# Patient Record
Sex: Male | Born: 1945 | Race: White | Hispanic: No | Marital: Married
Health system: Southern US, Community
[De-identification: ages and names within clinical notes are randomized; demographics above are authoritative.]

---

## 2016-01-30 DIAGNOSIS — Y92410 Unspecified street and highway as the place of occurrence of the external cause: Secondary | ICD-10-CM | POA: Diagnosis not present

## 2016-01-30 DIAGNOSIS — Y9389 Activity, other specified: Secondary | ICD-10-CM | POA: Insufficient documentation

## 2016-01-30 DIAGNOSIS — Y999 Unspecified external cause status: Secondary | ICD-10-CM | POA: Diagnosis not present

## 2016-01-30 DIAGNOSIS — S4992XA Unspecified injury of left shoulder and upper arm, initial encounter: Secondary | ICD-10-CM | POA: Diagnosis not present

## 2016-01-30 DIAGNOSIS — M25512 Pain in left shoulder: Secondary | ICD-10-CM | POA: Diagnosis present
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
## 2016-01-31 ENCOUNTER — Emergency Department: Payer: Commercial Managed Care - HMO

## 2016-01-31 ENCOUNTER — Emergency Department
Admission: EM | Admit: 2016-01-31 | Discharge: 2016-01-31 | Disposition: A | Discharge status: Home / Self Care | Payer: Commercial Managed Care - HMO | Attending: Emergency Medicine | Admitting: Emergency Medicine

## 2016-01-31 DIAGNOSIS — T1490XA Injury, unspecified, initial encounter: Secondary | ICD-10-CM

## 2016-01-31 DIAGNOSIS — S4992XA Unspecified injury of left shoulder and upper arm, initial encounter: Secondary | ICD-10-CM | POA: Diagnosis not present

## 2016-01-31 DIAGNOSIS — M25512 Pain in left shoulder: Secondary | ICD-10-CM

## 2016-01-31 MED ORDER — OXYCODONE-ACETAMINOPHEN 5-325 MG PO TABS
1.0000 | ORAL_TABLET | Freq: Once | ORAL | Status: AC
  Administered 2016-01-31: 1 via ORAL

## 2016-01-31 MED ORDER — OXYCODONE-ACETAMINOPHEN 5-325 MG PO TABS: 1.0000 | ORAL_TABLET | ORAL | 0 refills | Status: AC | PRN

## 2016-01-31 MED ORDER — BACITRACIN ZINC 500 UNIT/GM EX OINT
TOPICAL_OINTMENT | Freq: Once | CUTANEOUS | Status: AC
  Administered 2016-01-31: 1 via TOPICAL

## 2016-01-31 MED ORDER — BACITRACIN ZINC 500 UNIT/GM EX OINT
TOPICAL_OINTMENT | CUTANEOUS | Status: AC
  Administered 2016-01-31: 1 via TOPICAL
  Filled 2016-01-31: qty 0.9

## 2016-01-31 MED ORDER — OXYCODONE-ACETAMINOPHEN 5-325 MG PO TABS
ORAL_TABLET | ORAL | Status: AC
  Administered 2016-01-31: 1 via ORAL
  Filled 2016-01-31: qty 1

## 2016-01-31 MED ORDER — OXYCODONE-ACETAMINOPHEN 5-325 MG PO TABS
2.0000 | ORAL_TABLET | Freq: Once | ORAL | Status: AC
  Administered 2016-01-31: 2 via ORAL
  Filled 2016-01-31: qty 2

## 2016-01-31 NOTE — ED Notes (Signed)
Pt. In room #8 with wife waiting for her discharge.

## 2016-01-31 NOTE — ED Notes (Signed)
Patient transported to X-ray 

## 2016-01-31 NOTE — ED Notes (Signed)
Pt's left arm placed in sling
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
## 2016-01-31 NOTE — ED Notes (Signed)
Pt returned from xray
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
## 2016-06-22 DIAGNOSIS — R3914 Feeling of incomplete bladder emptying: Secondary | ICD-10-CM | POA: Diagnosis not present

## 2016-06-22 DIAGNOSIS — R350 Frequency of micturition: Secondary | ICD-10-CM | POA: Diagnosis not present

## 2016-06-22 DIAGNOSIS — N403 Nodular prostate with lower urinary tract symptoms: Secondary | ICD-10-CM | POA: Diagnosis not present

## 2016-06-22 DIAGNOSIS — R3915 Urgency of urination: Secondary | ICD-10-CM | POA: Diagnosis not present

## 2016-06-22 DIAGNOSIS — N5201 Erectile dysfunction due to arterial insufficiency: Secondary | ICD-10-CM | POA: Diagnosis not present

## 2016-06-22 DIAGNOSIS — R351 Nocturia: Secondary | ICD-10-CM | POA: Diagnosis not present

## 2016-06-28 DIAGNOSIS — N403 Nodular prostate with lower urinary tract symptoms: Secondary | ICD-10-CM | POA: Diagnosis not present

## 2016-06-28 DIAGNOSIS — R102 Pelvic and perineal pain: Secondary | ICD-10-CM | POA: Diagnosis not present

## 2016-06-28 DIAGNOSIS — R972 Elevated prostate specific antigen [PSA]: Secondary | ICD-10-CM | POA: Diagnosis not present

## 2016-06-28 DIAGNOSIS — N4232 Atypical small acinar proliferation of prostate: Secondary | ICD-10-CM | POA: Diagnosis not present

## 2016-06-28 DIAGNOSIS — C61 Malignant neoplasm of prostate: Secondary | ICD-10-CM | POA: Diagnosis not present

## 2016-07-13 DIAGNOSIS — N5201 Erectile dysfunction due to arterial insufficiency: Secondary | ICD-10-CM | POA: Diagnosis not present

## 2016-07-13 DIAGNOSIS — R972 Elevated prostate specific antigen [PSA]: Secondary | ICD-10-CM | POA: Diagnosis not present

## 2016-07-13 DIAGNOSIS — C61 Malignant neoplasm of prostate: Secondary | ICD-10-CM | POA: Diagnosis not present

## 2016-11-07 DIAGNOSIS — M65312 Trigger thumb, left thumb: Secondary | ICD-10-CM | POA: Diagnosis not present

## 2016-11-07 DIAGNOSIS — M79642 Pain in left hand: Secondary | ICD-10-CM | POA: Diagnosis not present

## 2016-12-21 DIAGNOSIS — N5201 Erectile dysfunction due to arterial insufficiency: Secondary | ICD-10-CM | POA: Diagnosis not present

## 2016-12-21 DIAGNOSIS — C61 Malignant neoplasm of prostate: Secondary | ICD-10-CM | POA: Diagnosis not present

## 2016-12-21 DIAGNOSIS — D400 Neoplasm of uncertain behavior of prostate: Secondary | ICD-10-CM | POA: Diagnosis not present

## 2017-04-17 DIAGNOSIS — J069 Acute upper respiratory infection, unspecified: Secondary | ICD-10-CM | POA: Diagnosis not present
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
## 2017-11-06 IMAGING — CR DG HAND COMPLETE 3+V*R*
1 series · 3 of 3 positions shown · non-contrast
Comparison: None.

CLINICAL DATA: Pain and laceration to the fourth metacarpal

EXAM:
RIGHT HAND - COMPLETE 3+ VIEW

[Series 1: dg hand complete right · 0.14mm/px · 3 of 3 slices shown]
[im 1/3]
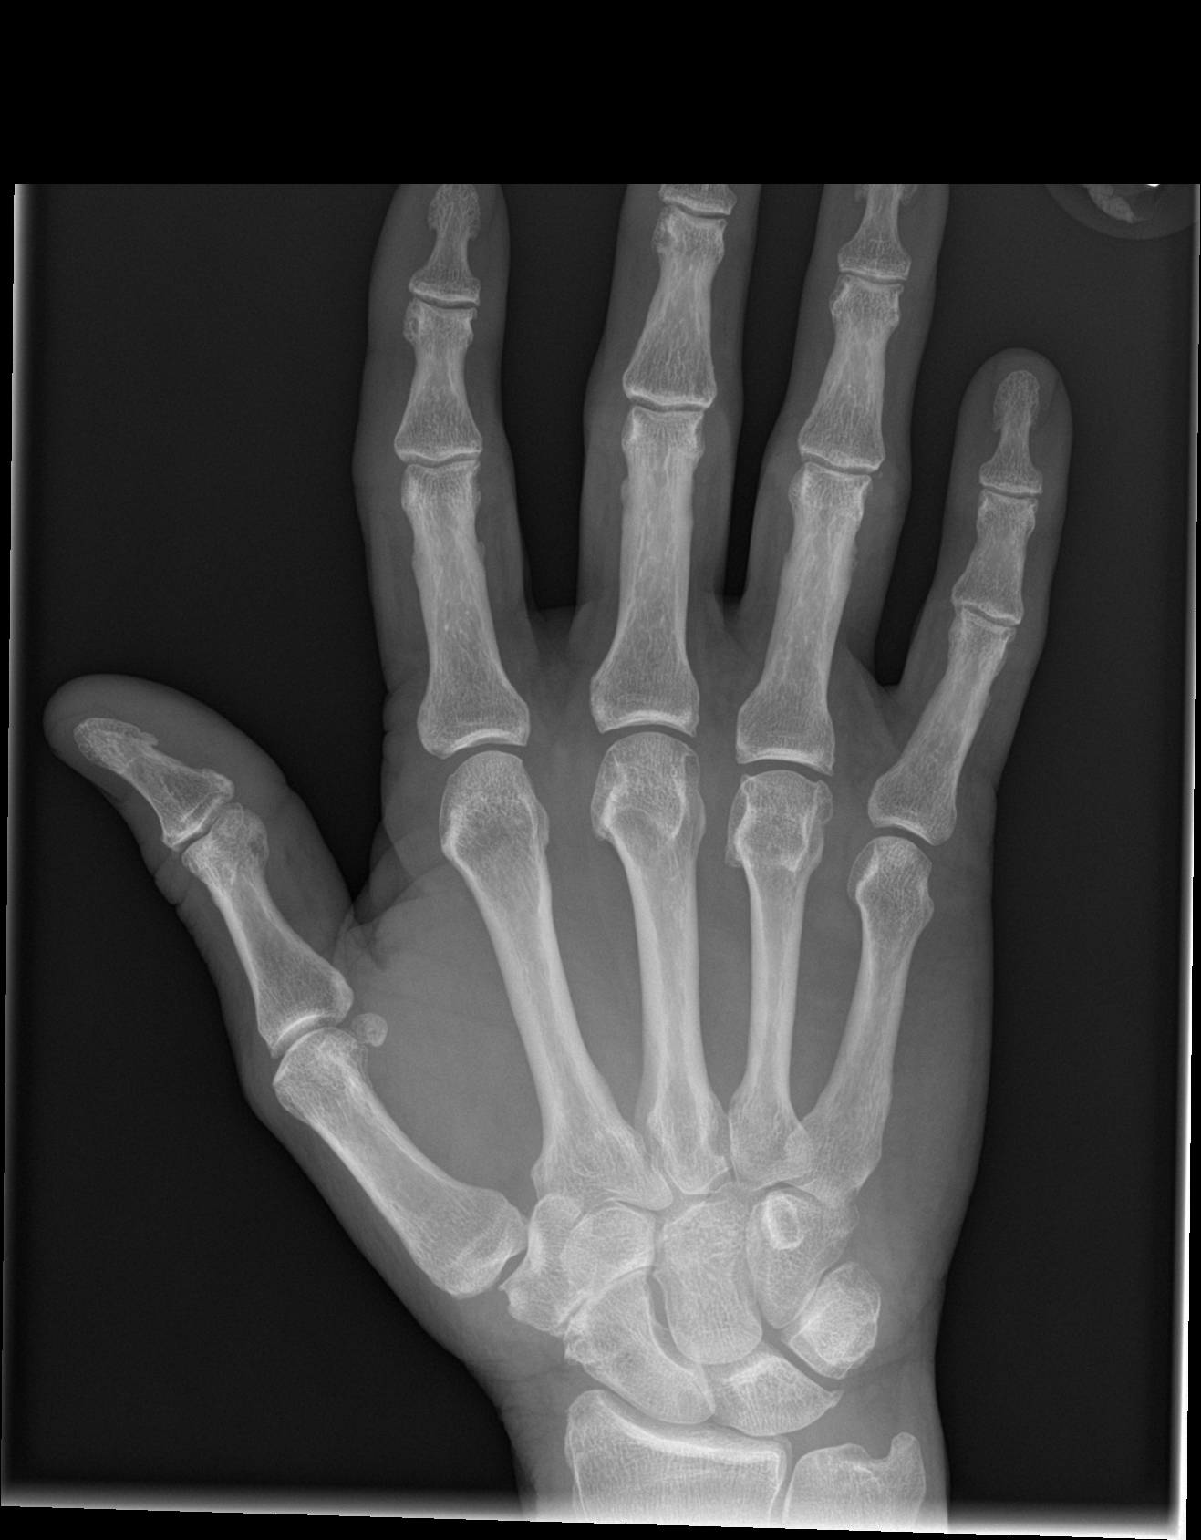
[im 2/3]
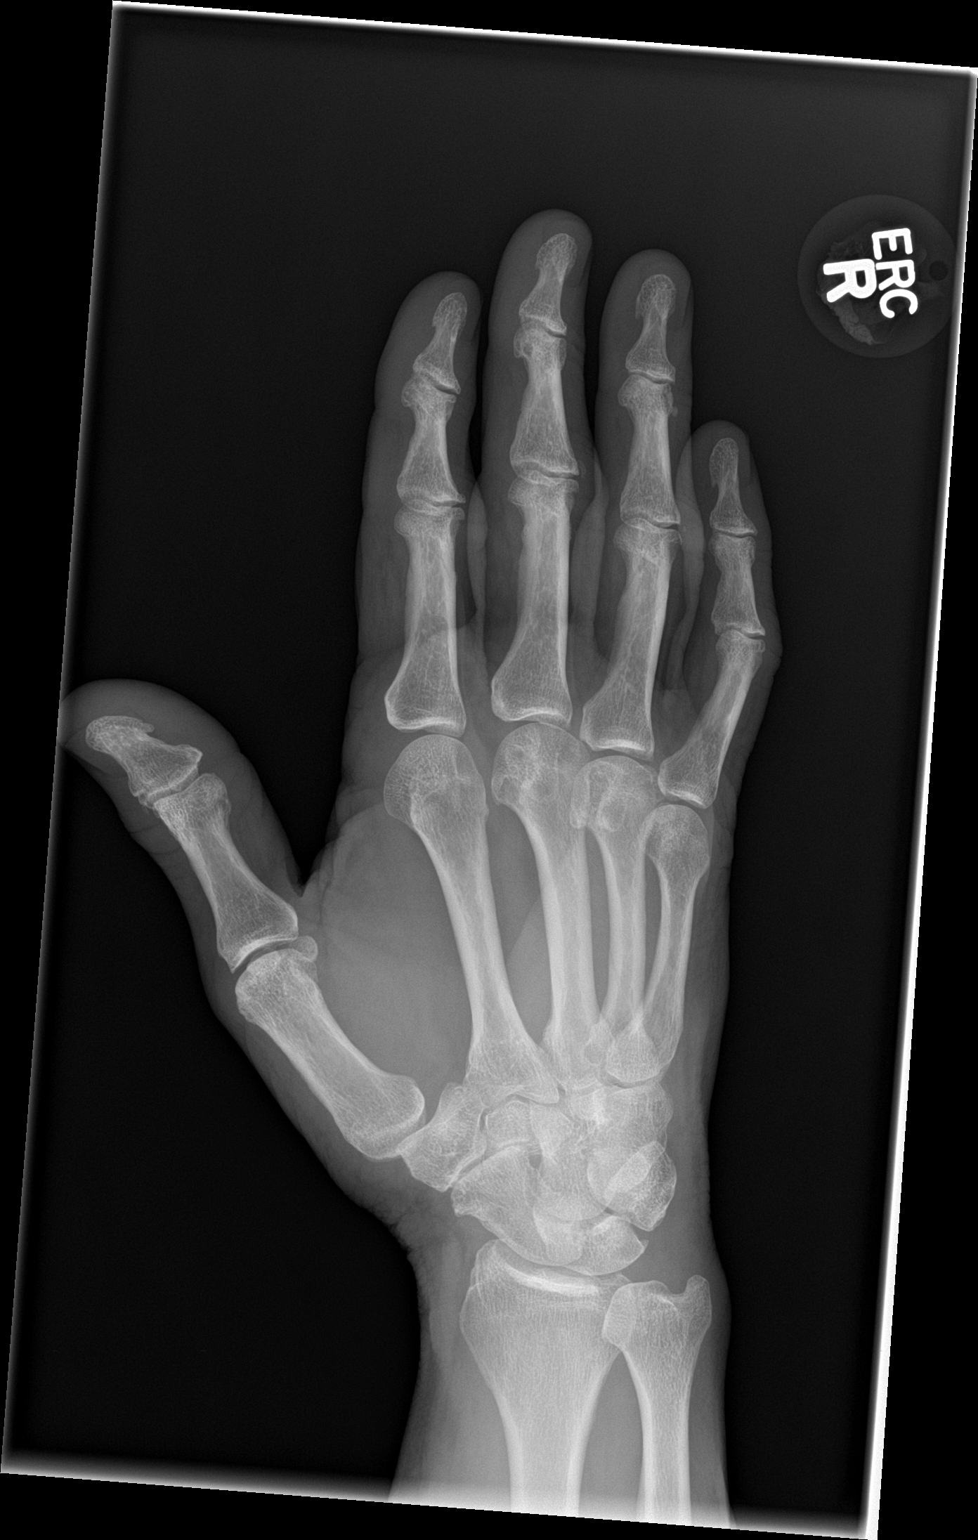
[im 3/3]
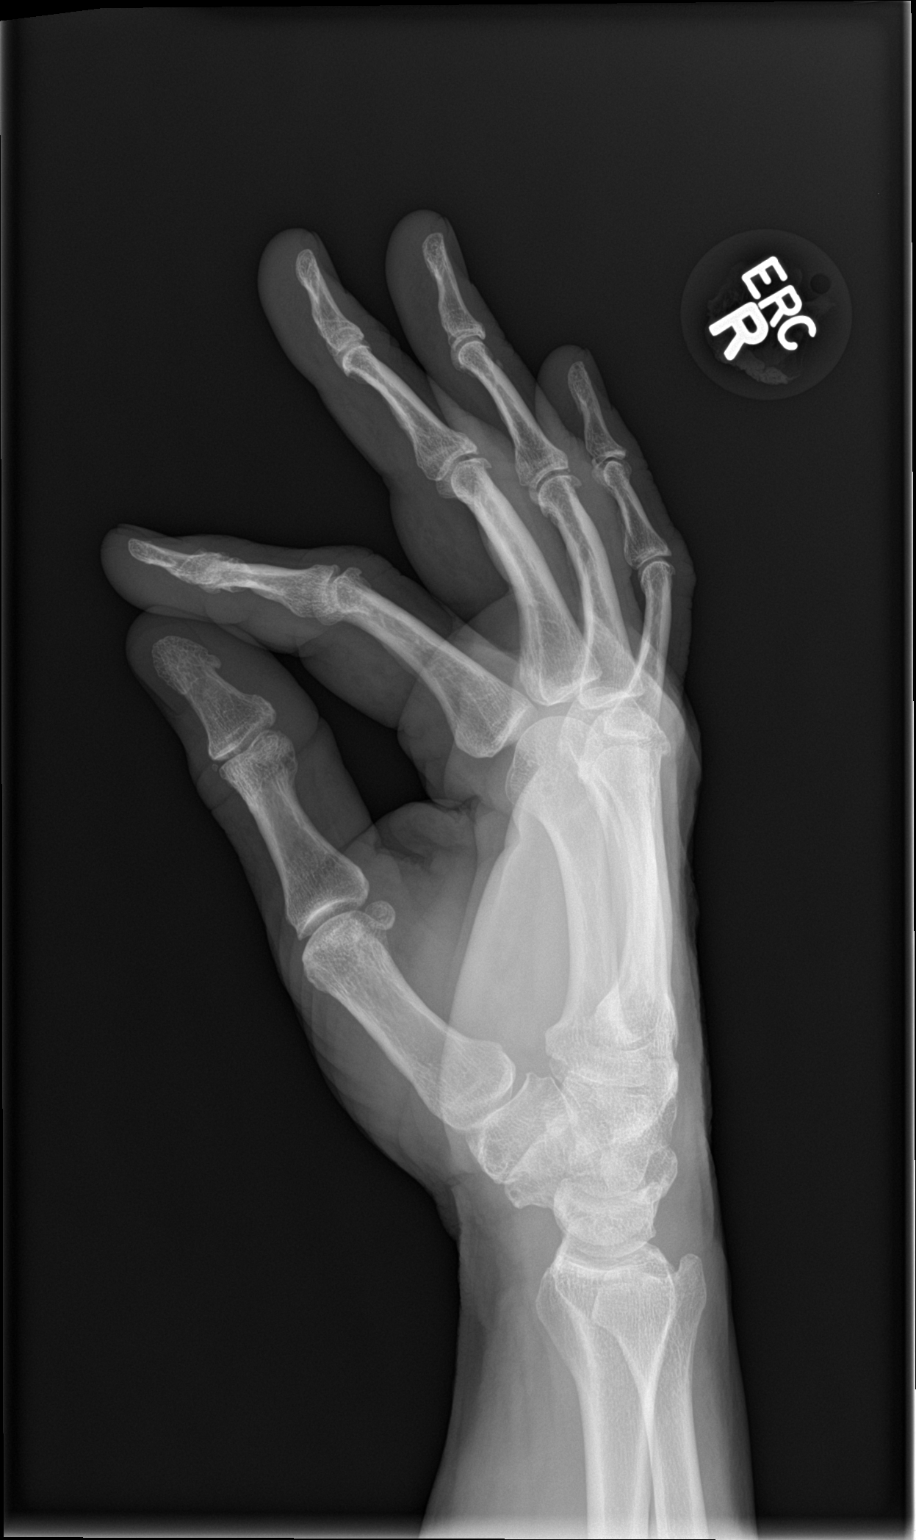

[3 of 3 positions shown; findings below may reference images not displayed]

FINDINGS: No acute displaced fracture or malalignment. Degenerative changes at
the second through fifth DIP and PIP joints. No radiopaque foreign
body.
IMPRESSION: No acute osseous abnormality.

## 2018-09-11 ENCOUNTER — Ambulatory Visit: Payer: Medicare HMO | Attending: Hand Surgery | Admitting: Occupational Therapy

## 2018-09-11 ENCOUNTER — Other Ambulatory Visit: Payer: Self-pay

## 2018-09-11 DIAGNOSIS — M25632 Stiffness of left wrist, not elsewhere classified: Secondary | ICD-10-CM | POA: Insufficient documentation

## 2018-09-11 DIAGNOSIS — R600 Localized edema: Secondary | ICD-10-CM | POA: Diagnosis present

## 2018-09-11 DIAGNOSIS — M25642 Stiffness of left hand, not elsewhere classified: Secondary | ICD-10-CM | POA: Diagnosis present

## 2018-09-11 DIAGNOSIS — M25532 Pain in left wrist: Secondary | ICD-10-CM | POA: Diagnosis present

## 2018-09-11 DIAGNOSIS — M6281 Muscle weakness (generalized): Secondary | ICD-10-CM | POA: Diagnosis present

## 2018-09-11 DIAGNOSIS — L905 Scar conditions and fibrosis of skin: Secondary | ICD-10-CM | POA: Insufficient documentation

## 2018-09-11 DIAGNOSIS — M79642 Pain in left hand: Secondary | ICD-10-CM
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
## 2018-09-11 NOTE — Patient Instructions (Signed)
Contrast 2-3 x day Knuckle bender on for 1-3 min while doing composite flexion of digits  keep pain under 2/10  5 reps hold 5 sec  Remove  And do tendon glides - block - and 10 reps  composite fist to 2 cm foam block  10 reps   AAROM for wrist over edge of table  10 reps  AROM for UD, RD, sup /pro and flexion ,ext of wrist  10 reps  Keep pain under 2/10  And reinforce weight limited for pt 5 lbs

## 2018-09-13 ENCOUNTER — Other Ambulatory Visit: Payer: Self-pay

## 2018-09-13 ENCOUNTER — Ambulatory Visit: Payer: Medicare HMO | Admitting: Occupational Therapy

## 2018-09-13 DIAGNOSIS — M25532 Pain in left wrist: Secondary | ICD-10-CM | POA: Diagnosis not present

## 2018-09-13 DIAGNOSIS — R600 Localized edema: Secondary | ICD-10-CM

## 2018-09-13 DIAGNOSIS — M6281 Muscle weakness (generalized): Secondary | ICD-10-CM

## 2018-09-13 DIAGNOSIS — L905 Scar conditions and fibrosis of skin: Secondary | ICD-10-CM

## 2018-09-13 DIAGNOSIS — M25632 Stiffness of left wrist, not elsewhere classified: Secondary | ICD-10-CM

## 2018-09-13 DIAGNOSIS — M79642 Pain in left hand: Secondary | ICD-10-CM

## 2018-09-13 DIAGNOSIS — M25642 Stiffness of left hand, not elsewhere classified: Secondary | ICD-10-CM

## 2018-09-13 NOTE — Patient Instructions (Signed)
Same HEP - but reinforce not to push exercises past 2/10 pain  Back off when feeling pull  Scar massage ed on and use of Cica scar pad at night time - tubigrip instead of isotoner glove for few nights
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
## 2018-09-18 ENCOUNTER — Ambulatory Visit: Payer: Medicare HMO | Admitting: Occupational Therapy

## 2018-09-18 ENCOUNTER — Other Ambulatory Visit: Payer: Self-pay

## 2018-09-18 DIAGNOSIS — M79642 Pain in left hand: Secondary | ICD-10-CM

## 2018-09-18 DIAGNOSIS — M25532 Pain in left wrist: Secondary | ICD-10-CM

## 2018-09-18 DIAGNOSIS — M6281 Muscle weakness (generalized): Secondary | ICD-10-CM

## 2018-09-18 DIAGNOSIS — M25642 Stiffness of left hand, not elsewhere classified: Secondary | ICD-10-CM

## 2018-09-18 DIAGNOSIS — L905 Scar conditions and fibrosis of skin: Secondary | ICD-10-CM

## 2018-09-18 DIAGNOSIS — R600 Localized edema: Secondary | ICD-10-CM

## 2018-09-18 DIAGNOSIS — M25632 Stiffness of left wrist, not elsewhere classified: Secondary | ICD-10-CM

## 2018-09-18 NOTE — Patient Instructions (Signed)
Add pronatin wheel - for AAROM Followed by AROM   Gentle PROM over armrest for wrist flexion  And light blue putty gripping with rolling inbetween 12 reps   3  X day - end of HEP
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
## 2018-09-20 ENCOUNTER — Other Ambulatory Visit: Payer: Self-pay

## 2018-09-20 ENCOUNTER — Ambulatory Visit: Payer: Medicare HMO | Admitting: Occupational Therapy

## 2018-09-20 DIAGNOSIS — M6281 Muscle weakness (generalized): Secondary | ICD-10-CM

## 2018-09-20 DIAGNOSIS — L905 Scar conditions and fibrosis of skin: Secondary | ICD-10-CM

## 2018-09-20 DIAGNOSIS — R600 Localized edema: Secondary | ICD-10-CM

## 2018-09-20 DIAGNOSIS — M25642 Stiffness of left hand, not elsewhere classified: Secondary | ICD-10-CM

## 2018-09-20 DIAGNOSIS — M25532 Pain in left wrist: Secondary | ICD-10-CM | POA: Diagnosis not present

## 2018-09-20 DIAGNOSIS — M25632 Stiffness of left wrist, not elsewhere classified: Secondary | ICD-10-CM

## 2018-09-20 DIAGNOSIS — M79642 Pain in left hand: Secondary | ICD-10-CM
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
## 2018-09-20 NOTE — Patient Instructions (Signed)
Remove AROM for wrist after PROM/AAROM  Replace with 1 lbs/16oz hammer close to head holding - for sup/pro, RD, UD, flexion, extention  10 reps  can increase to 2 sets in 3 days if no increase pain

## 2018-09-25 ENCOUNTER — Other Ambulatory Visit: Payer: Self-pay

## 2018-09-25 ENCOUNTER — Ambulatory Visit: Payer: Medicare HMO | Admitting: Occupational Therapy

## 2018-09-25 DIAGNOSIS — R600 Localized edema: Secondary | ICD-10-CM

## 2018-09-25 DIAGNOSIS — L905 Scar conditions and fibrosis of skin: Secondary | ICD-10-CM

## 2018-09-25 DIAGNOSIS — M25642 Stiffness of left hand, not elsewhere classified: Secondary | ICD-10-CM

## 2018-09-25 DIAGNOSIS — M25632 Stiffness of left wrist, not elsewhere classified: Secondary | ICD-10-CM

## 2018-09-25 DIAGNOSIS — M79642 Pain in left hand: Secondary | ICD-10-CM

## 2018-09-25 DIAGNOSIS — M6281 Muscle weakness (generalized): Secondary | ICD-10-CM

## 2018-09-25 DIAGNOSIS — M25532 Pain in left wrist: Secondary | ICD-10-CM

## 2018-09-25 NOTE — Patient Instructions (Signed)
Cont with AAROM and PROM for wrist - focus on wrist extention this next few days 16oz hammer cont with but 3 sets of 10  Pain free And cont tendon glides and knucklebender - with composite flexion  Light blue putty gripping - but increase to 2 sets of 12 reps  Pain free   cont to do contrast prior and ice at end
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
## 2018-09-27 ENCOUNTER — Ambulatory Visit: Payer: Medicare HMO | Admitting: Occupational Therapy

## 2018-09-27 ENCOUNTER — Other Ambulatory Visit: Payer: Self-pay

## 2018-09-27 ENCOUNTER — Encounter: Payer: Non-veteran care | Admitting: Occupational Therapy

## 2018-09-27 DIAGNOSIS — M6281 Muscle weakness (generalized): Secondary | ICD-10-CM

## 2018-09-27 DIAGNOSIS — M79642 Pain in left hand: Secondary | ICD-10-CM

## 2018-09-27 DIAGNOSIS — M25532 Pain in left wrist: Secondary | ICD-10-CM

## 2018-09-27 DIAGNOSIS — M25632 Stiffness of left wrist, not elsewhere classified: Secondary | ICD-10-CM

## 2018-09-27 DIAGNOSIS — L905 Scar conditions and fibrosis of skin: Secondary | ICD-10-CM

## 2018-09-27 DIAGNOSIS — M25642 Stiffness of left hand, not elsewhere classified: Secondary | ICD-10-CM

## 2018-09-27 DIAGNOSIS — R600 Localized edema: Secondary | ICD-10-CM

## 2018-09-27 NOTE — Patient Instructions (Signed)
Increase to 2 lbs for pronation, sup , RD, UD - 10 reps   2 x day but support pronation  Cont with 16 oz hammer for wrist flexion , extention 3 x 10 reps   Increase putty to teal putty  2 x 12 reps   increase Sunday to 3 sets   Cont with same HEP for digits and wrist AAROM and AROM
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
## 2018-10-02 ENCOUNTER — Ambulatory Visit: Payer: Medicare HMO | Admitting: Occupational Therapy

## 2018-10-02 ENCOUNTER — Other Ambulatory Visit: Payer: Self-pay

## 2018-10-02 DIAGNOSIS — M25532 Pain in left wrist: Secondary | ICD-10-CM | POA: Diagnosis not present

## 2018-10-02 DIAGNOSIS — M25632 Stiffness of left wrist, not elsewhere classified: Secondary | ICD-10-CM

## 2018-10-02 DIAGNOSIS — R600 Localized edema: Secondary | ICD-10-CM

## 2018-10-02 DIAGNOSIS — M6281 Muscle weakness (generalized): Secondary | ICD-10-CM

## 2018-10-02 DIAGNOSIS — L905 Scar conditions and fibrosis of skin: Secondary | ICD-10-CM

## 2018-10-02 DIAGNOSIS — M79642 Pain in left hand: Secondary | ICD-10-CM

## 2018-10-02 DIAGNOSIS — M25642 Stiffness of left hand, not elsewhere classified: Secondary | ICD-10-CM

## 2018-10-02 NOTE — Patient Instructions (Signed)
Upgrade HEP to 2 lbs for wrist in all planes - 12 reps  2 xday - pain free  Sup and then rest in pronation  And place and hold extention of wrist   Add to putty HEP - pulling and twisting 12 reps  The teal putty - pain free  Cont with ROM and [place] still
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
## 2018-10-04 ENCOUNTER — Other Ambulatory Visit: Payer: Self-pay

## 2018-10-04 ENCOUNTER — Ambulatory Visit: Payer: Medicare HMO | Attending: Hand Surgery | Admitting: Occupational Therapy

## 2018-10-04 DIAGNOSIS — M25632 Stiffness of left wrist, not elsewhere classified: Secondary | ICD-10-CM

## 2018-10-04 DIAGNOSIS — M25561 Pain in right knee: Secondary | ICD-10-CM | POA: Diagnosis present

## 2018-10-04 DIAGNOSIS — M79642 Pain in left hand: Secondary | ICD-10-CM | POA: Diagnosis present

## 2018-10-04 DIAGNOSIS — R600 Localized edema: Secondary | ICD-10-CM

## 2018-10-04 DIAGNOSIS — M25552 Pain in left hip: Secondary | ICD-10-CM | POA: Insufficient documentation

## 2018-10-04 DIAGNOSIS — G8929 Other chronic pain: Secondary | ICD-10-CM | POA: Insufficient documentation

## 2018-10-04 DIAGNOSIS — M25532 Pain in left wrist: Secondary | ICD-10-CM | POA: Insufficient documentation

## 2018-10-04 DIAGNOSIS — M25642 Stiffness of left hand, not elsewhere classified: Secondary | ICD-10-CM | POA: Diagnosis not present

## 2018-10-04 DIAGNOSIS — M6281 Muscle weakness (generalized): Secondary | ICD-10-CM | POA: Insufficient documentation

## 2018-10-04 DIAGNOSIS — L905 Scar conditions and fibrosis of skin: Secondary | ICD-10-CM | POA: Insufficient documentation
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
## 2018-10-04 NOTE — Patient Instructions (Signed)
See note  Upgrade putty  And cont 2 lbs weight for wrist  Add table slides

## 2018-10-09 ENCOUNTER — Ambulatory Visit: Payer: Medicare HMO | Admitting: Occupational Therapy

## 2018-10-10 ENCOUNTER — Ambulatory Visit: Payer: Medicare HMO | Admitting: Occupational Therapy

## 2018-10-10 ENCOUNTER — Ambulatory Visit: Payer: Medicare HMO

## 2018-10-10 ENCOUNTER — Other Ambulatory Visit: Payer: Self-pay

## 2018-10-10 DIAGNOSIS — M25642 Stiffness of left hand, not elsewhere classified: Secondary | ICD-10-CM | POA: Diagnosis not present

## 2018-10-10 DIAGNOSIS — M79642 Pain in left hand: Secondary | ICD-10-CM

## 2018-10-10 DIAGNOSIS — M6281 Muscle weakness (generalized): Secondary | ICD-10-CM

## 2018-10-10 DIAGNOSIS — M25561 Pain in right knee: Secondary | ICD-10-CM

## 2018-10-10 DIAGNOSIS — M25532 Pain in left wrist: Secondary | ICD-10-CM

## 2018-10-10 DIAGNOSIS — G8929 Other chronic pain: Secondary | ICD-10-CM

## 2018-10-10 DIAGNOSIS — L905 Scar conditions and fibrosis of skin: Secondary | ICD-10-CM

## 2018-10-10 DIAGNOSIS — M25552 Pain in left hip: Secondary | ICD-10-CM

## 2018-10-10 DIAGNOSIS — M25632 Stiffness of left wrist, not elsewhere classified: Secondary | ICD-10-CM

## 2018-10-10 DIAGNOSIS — R600 Localized edema: Secondary | ICD-10-CM
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
## 2018-10-11 ENCOUNTER — Encounter: Payer: Non-veteran care | Admitting: Occupational Therapy

## 2018-10-11 NOTE — Addendum Note (Signed)
Addended by: Blain Pais on: 10/11/2018 04:59 PM   Modules accepted: Orders

## 2018-10-15 ENCOUNTER — Ambulatory Visit: Payer: Medicare HMO

## 2018-10-15 ENCOUNTER — Encounter: Payer: Non-veteran care | Admitting: Occupational Therapy

## 2018-10-16 ENCOUNTER — Ambulatory Visit: Payer: Medicare HMO

## 2018-10-16 ENCOUNTER — Encounter: Payer: Non-veteran care | Admitting: Occupational Therapy

## 2018-10-17 ENCOUNTER — Ambulatory Visit: Payer: Medicare HMO | Admitting: Occupational Therapy

## 2018-10-17 ENCOUNTER — Other Ambulatory Visit: Payer: Self-pay

## 2018-10-17 ENCOUNTER — Ambulatory Visit: Payer: Medicare HMO

## 2018-10-17 ENCOUNTER — Encounter: Payer: Self-pay | Admitting: Occupational Therapy

## 2018-10-17 DIAGNOSIS — G8929 Other chronic pain: Secondary | ICD-10-CM

## 2018-10-17 DIAGNOSIS — M25642 Stiffness of left hand, not elsewhere classified: Secondary | ICD-10-CM

## 2018-10-17 DIAGNOSIS — R600 Localized edema: Secondary | ICD-10-CM

## 2018-10-17 DIAGNOSIS — M25552 Pain in left hip: Secondary | ICD-10-CM

## 2018-10-17 DIAGNOSIS — L905 Scar conditions and fibrosis of skin: Secondary | ICD-10-CM

## 2018-10-17 DIAGNOSIS — M79642 Pain in left hand: Secondary | ICD-10-CM

## 2018-10-17 DIAGNOSIS — M25532 Pain in left wrist: Secondary | ICD-10-CM

## 2018-10-17 DIAGNOSIS — M25561 Pain in right knee: Secondary | ICD-10-CM

## 2018-10-17 DIAGNOSIS — M25632 Stiffness of left wrist, not elsewhere classified: Secondary | ICD-10-CM

## 2018-10-17 DIAGNOSIS — M6281 Muscle weakness (generalized): Secondary | ICD-10-CM
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
## 2018-10-17 NOTE — Therapy (Signed)
[facility] [address] [place], [address] Phone: [phone]   Fax:  [phone]  Physical Therapy Treatment  Patient Details  Name: Sean Nichols MRN: [id number] Date of Birth: [birth year] Referring Provider (PT): Hildred Alamin   Encounter Date: 10/17/2018  PT End of Session - 10/17/18 1636    Visit Number  2    Number of Visits  13    Date for PT Re-Evaluation  11/21/18    PT Start Time  6962    PT Stop Time  1600    PT Time Calculation (min)  45 min    Activity Tolerance  Patient tolerated treatment well    Behavior During Therapy  [facility] for tasks assessed/performed       History reviewed. No pertinent past medical history.  History reviewed. No pertinent surgical history.  There were no vitals filed for this visit.  Subjective Assessment - 10/17/18 1629    Subjective  Pt reports being able to perform yard work for 3 hours this morning (seated lawn-mower) with moderate difficulty. He also reports having had his gabapentin medication dosage increased recently, which has affected his cognition and making him more "foggyheaded." He reports being able to complete his HEP, citing the SLS to be the most difficult.    Currently in Pain?  Yes    Pain Score  2     Pain Location  Hip    Pain Orientation  Left    Pain Descriptors / Indicators  Aching;Dull    Pain Type  Chronic pain    Pain Onset  More than a month ago    Multiple Pain Sites  No      Manual Therapy to address and improve his pain response and muscular tightness/spasms. STM to left piriformis, improved pain.  Central PA joint mobs at L4-S3, grades I-II  Therapeutic Exercise to address and improve his activity tolerance and strength. Prone on elbows 5 min, 8 press ups, 5 mins, 8 press ups Squats to chair 4x15 with emphasis of posterior weight translation and L>R weight shift. Gives tactile/verbal cueing with right foam pad and left  vertical half-foam roll.  Standing hip extensions x12 each side with unilateral UE support Unilateral step downs at 6-inch step with unilateral UE support x10 each side  Pt reported a final 4/10 pain left hip (concordant sx) at the end of session.     [facility] Adult PT Treatment/Exercise - 10/17/18 1632      Manual Therapy   Manual Therapy  Soft tissue mobilization;Joint mobilization    Joint Mobilization  Joint mobs at L4-S3 (Grades I-II)    Soft tissue mobilization  STM to left piriformis region, improved pain response             PT Education - 10/17/18 1634    Education Details  Pt continued to be educated on plan of care and HEP progession: standing hip extensions 2x12 at counter, prone press ups 2x10. Pt educated on form/technique of squats, with emphasis of equal weight acceptance and posterior chain activation.    Person(s) Educated  Patient    Methods  Explanation;Demonstration;Tactile cues;Verbal cues;Handout    Comprehension  Verbalized understanding;Returned demonstration;Verbal cues required;Tactile cues required;Need further instruction       PT Short Term Goals - 10/10/18 1716      PT SHORT TERM GOAL #1   Title  In 2 weeks, pt will be independent and compliant with HEP.  Baseline  Reports being inactive at home.    Time  2    Period  Weeks    Status  New    Target Date  10/24/18        PT Long Term Goals - 10/10/18 1717      PT LONG TERM GOAL #1   Title  In 6 weeks, pt will reported weekly average of 0/10 pain with bilateral LE, demonstrating improved function and quality of life.    Baseline  4/10 at left hip, 1/10 at right knee    Time  6    Period  Weeks    Status  New    Target Date  11/21/18      PT LONG TERM GOAL #2   Title  Patient will be able to stand for over 4 hours to better be able to perform recreational activities such as work on vechiles at home.    Baseline  2 hours before increased pain    Time  6    Period  Weeks    Status   New    Target Date  11/21/18      PT LONG TERM GOAL #3   Title  Patient will demonstrate equalateral hip strength B to better perform a greater amount of prolonged standing    Baseline  See obective measurements for exact hip impairments.    Time  6    Period  Weeks    Status  New    Target Date  11/21/18            Plan - 10/17/18 1637    Clinical Impression Statement  Pt demonstrated improved pain with targeted manual therapy interventions and active exercise, citing resolution of sx during prone on elbows. Pt cited increased sx when prompted to weight shift to his left leg during mini squats, through usage of half foam roll (to cue posterior weight shift) and foam pad under his right foot (to cue left weight shift), though intensity of sx improved with increased reps. Pts technique/form improved with practice, as well. Pt demonstrated increased right weight shift at resting but improved with targeted cueing (tactile/verbal). Pt also demonstrated decreased motor control (L>R) with unilateral step downs from 6-inch step (UE support unilaterally). Pt verbalized having increased muscle strain at bilateral knees but no concordant sx with step downs. At the end of the session, pt reported a 4/10 pain at his left hip but verbalized encouragement for plan of care. Pt verbalized understanding for prescribed HEP and rationale behind the interventions. Pt will continue to benefit from skilled therapy treatment in order to address and improve his aforementioned impairments, which will allow him to return to prior level of function.    Personal Factors and Comorbidities  Age;Past/Current Experience    Examination-Activity Limitations  Squat;Locomotion Level;Stairs;Stand    Examination-Participation Restrictions  Community Activity    Stability/Clinical Decision Making  Stable/Uncomplicated    Clinical Decision Making  Low    Rehab Potential  Good    PT Frequency  2x / week    PT Duration  6 weeks     PT Treatment/Interventions  ADLs/Self Care Home Management;Therapeutic activities;Functional mobility training;Stair training;Gait training;Therapeutic exercise;Balance training;Neuromuscular re-education;Patient/family education;Manual techniques;Passive range of motion;Dry needling;Joint Manipulations;Spinal Manipulations    PT Next Visit Plan  Progress HEP, strengthening hips/lower back/quads concentrically and eccentrically.    PT Home Exercise Plan  See education section.    Consulted and Agree with Plan of Care  Patient  Patient will benefit from skilled therapeutic intervention in order to improve the following deficits and impairments:  Abnormal gait, Decreased activity tolerance, Decreased balance, Decreased endurance, Decreased mobility, Difficulty walking, Decreased range of motion, Hypomobility, Improper body mechanics, Postural dysfunction, Impaired UE functional use, Impaired flexibility, Decreased strength, Pain  Visit Diagnosis: 1. Pain in left hip   2. Right knee pain        Problem List There are no active problems to display for this patient.   Scarlette Calico, SPT 10/17/2018, 4:55 PM  [facility] [address], [place], [address] Phone: [phone]   Fax:  [phone]  Name: Sean Nichols MRN: [id number] Date of Birth: Jun 16, 1945

## 2018-10-18 ENCOUNTER — Encounter: Payer: Self-pay | Admitting: Occupational Therapy

## 2018-10-18 ENCOUNTER — Encounter: Payer: Non-veteran care | Admitting: Occupational Therapy
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
## 2018-10-23 ENCOUNTER — Ambulatory Visit: Payer: Medicare HMO | Admitting: Occupational Therapy

## 2018-10-23 ENCOUNTER — Other Ambulatory Visit: Payer: Self-pay

## 2018-10-23 DIAGNOSIS — M25642 Stiffness of left hand, not elsewhere classified: Secondary | ICD-10-CM | POA: Diagnosis not present

## 2018-10-23 DIAGNOSIS — M79642 Pain in left hand: Secondary | ICD-10-CM

## 2018-10-23 DIAGNOSIS — L905 Scar conditions and fibrosis of skin: Secondary | ICD-10-CM

## 2018-10-23 DIAGNOSIS — M6281 Muscle weakness (generalized): Secondary | ICD-10-CM

## 2018-10-23 DIAGNOSIS — M25532 Pain in left wrist: Secondary | ICD-10-CM

## 2018-10-23 DIAGNOSIS — M25632 Stiffness of left wrist, not elsewhere classified: Secondary | ICD-10-CM
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
## 2018-10-23 NOTE — Patient Instructions (Signed)
See note for changes to HEP  

## 2018-10-24 ENCOUNTER — Other Ambulatory Visit: Payer: Self-pay

## 2018-10-24 ENCOUNTER — Ambulatory Visit: Payer: Medicare HMO

## 2018-10-24 DIAGNOSIS — M25642 Stiffness of left hand, not elsewhere classified: Secondary | ICD-10-CM | POA: Diagnosis not present

## 2018-10-24 DIAGNOSIS — M6281 Muscle weakness (generalized): Secondary | ICD-10-CM

## 2018-10-24 DIAGNOSIS — M25552 Pain in left hip: Secondary | ICD-10-CM

## 2018-10-24 DIAGNOSIS — G8929 Other chronic pain: Secondary | ICD-10-CM
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
## 2018-10-30 ENCOUNTER — Other Ambulatory Visit: Payer: Self-pay

## 2018-10-30 ENCOUNTER — Ambulatory Visit: Payer: Medicare HMO

## 2018-10-30 ENCOUNTER — Ambulatory Visit: Payer: Medicare HMO | Admitting: Occupational Therapy

## 2018-10-30 DIAGNOSIS — G8929 Other chronic pain: Secondary | ICD-10-CM

## 2018-10-30 DIAGNOSIS — M25642 Stiffness of left hand, not elsewhere classified: Secondary | ICD-10-CM

## 2018-10-30 DIAGNOSIS — M25552 Pain in left hip: Secondary | ICD-10-CM

## 2018-10-30 DIAGNOSIS — R600 Localized edema: Secondary | ICD-10-CM

## 2018-10-30 DIAGNOSIS — M25532 Pain in left wrist: Secondary | ICD-10-CM

## 2018-10-30 DIAGNOSIS — M79642 Pain in left hand: Secondary | ICD-10-CM

## 2018-10-30 DIAGNOSIS — M6281 Muscle weakness (generalized): Secondary | ICD-10-CM

## 2018-10-30 DIAGNOSIS — M25632 Stiffness of left wrist, not elsewhere classified: Secondary | ICD-10-CM

## 2018-10-30 DIAGNOSIS — L905 Scar conditions and fibrosis of skin: Secondary | ICD-10-CM
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
## 2018-10-30 NOTE — Patient Instructions (Signed)
Same but add pronation from neutral with key grip

## 2018-11-01 ENCOUNTER — Ambulatory Visit: Payer: Medicare HMO

## 2018-11-01 ENCOUNTER — Other Ambulatory Visit: Payer: Self-pay

## 2018-11-01 ENCOUNTER — Ambulatory Visit: Payer: Medicare HMO | Admitting: Occupational Therapy

## 2018-11-01 DIAGNOSIS — M25552 Pain in left hip: Secondary | ICD-10-CM

## 2018-11-01 DIAGNOSIS — M6281 Muscle weakness (generalized): Secondary | ICD-10-CM

## 2018-11-01 DIAGNOSIS — M79642 Pain in left hand: Secondary | ICD-10-CM

## 2018-11-01 DIAGNOSIS — M25642 Stiffness of left hand, not elsewhere classified: Secondary | ICD-10-CM

## 2018-11-01 DIAGNOSIS — M25532 Pain in left wrist: Secondary | ICD-10-CM

## 2018-11-01 DIAGNOSIS — M25632 Stiffness of left wrist, not elsewhere classified: Secondary | ICD-10-CM

## 2018-11-01 DIAGNOSIS — L905 Scar conditions and fibrosis of skin: Secondary | ICD-10-CM
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
## 2018-11-01 NOTE — Patient Instructions (Signed)
Same - focus on PROM during heat for pronation  work on AROM pronation  Wrist extention with weight

## 2018-11-06 ENCOUNTER — Ambulatory Visit: Payer: Medicare HMO | Admitting: Occupational Therapy

## 2018-11-06 ENCOUNTER — Ambulatory Visit: Payer: Medicare HMO | Attending: Hand Surgery

## 2018-11-06 ENCOUNTER — Other Ambulatory Visit: Payer: Self-pay

## 2018-11-06 DIAGNOSIS — M25632 Stiffness of left wrist, not elsewhere classified: Secondary | ICD-10-CM | POA: Insufficient documentation

## 2018-11-06 DIAGNOSIS — M79642 Pain in left hand: Secondary | ICD-10-CM

## 2018-11-06 DIAGNOSIS — M6281 Muscle weakness (generalized): Secondary | ICD-10-CM

## 2018-11-06 DIAGNOSIS — M25532 Pain in left wrist: Secondary | ICD-10-CM

## 2018-11-06 DIAGNOSIS — R600 Localized edema: Secondary | ICD-10-CM | POA: Diagnosis present

## 2018-11-06 DIAGNOSIS — L905 Scar conditions and fibrosis of skin: Secondary | ICD-10-CM | POA: Insufficient documentation

## 2018-11-06 DIAGNOSIS — M25552 Pain in left hip: Secondary | ICD-10-CM

## 2018-11-06 DIAGNOSIS — M25642 Stiffness of left hand, not elsewhere classified: Secondary | ICD-10-CM | POA: Diagnosis present
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
## 2018-11-06 NOTE — Patient Instructions (Signed)
Cont with tendon glides and putty same   In heating pad to composite flexion and extention stretch for wrist  Followed by [employer] and prayer stretch   This date RTB for place and hold pronation 10 reps UD and RD RTB 10 reps   Scar mobs -  kinesiotape add for volar scar that is adhere - to do at home during day - precautions ed on

## 2018-11-08 ENCOUNTER — Ambulatory Visit: Payer: Medicare HMO

## 2018-11-08 ENCOUNTER — Other Ambulatory Visit: Payer: Self-pay

## 2018-11-08 DIAGNOSIS — M25552 Pain in left hip: Secondary | ICD-10-CM

## 2018-11-08 DIAGNOSIS — M6281 Muscle weakness (generalized): Secondary | ICD-10-CM
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
## 2018-11-12 ENCOUNTER — Other Ambulatory Visit: Payer: Self-pay

## 2018-11-12 ENCOUNTER — Ambulatory Visit: Payer: Medicare HMO | Admitting: Occupational Therapy

## 2018-11-12 ENCOUNTER — Ambulatory Visit: Payer: Medicare HMO

## 2018-11-12 DIAGNOSIS — M6281 Muscle weakness (generalized): Secondary | ICD-10-CM | POA: Diagnosis not present

## 2018-11-12 DIAGNOSIS — L905 Scar conditions and fibrosis of skin: Secondary | ICD-10-CM

## 2018-11-12 DIAGNOSIS — M79642 Pain in left hand: Secondary | ICD-10-CM

## 2018-11-12 DIAGNOSIS — M25642 Stiffness of left hand, not elsewhere classified: Secondary | ICD-10-CM

## 2018-11-12 DIAGNOSIS — M25632 Stiffness of left wrist, not elsewhere classified: Secondary | ICD-10-CM

## 2018-11-12 DIAGNOSIS — R600 Localized edema: Secondary | ICD-10-CM

## 2018-11-12 DIAGNOSIS — M25532 Pain in left wrist: Secondary | ICD-10-CM

## 2018-11-12 DIAGNOSIS — M25552 Pain in left hip: Secondary | ICD-10-CM
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
## 2018-11-12 NOTE — Patient Instructions (Signed)
Focus on stretches for wrist flexion, ext, RD, UD  End range pronation - isometric - with wife help  And RTB  And cont to do RTB for RD, UD  Switch from putty to gripping 10 lbs - 2-3 sets of 10 gripping and sustained grip 2 -3 sets 10 reps   pain free  Increase 15 lbs in 3 days if no issues  cont scar mobs

## 2018-11-14 ENCOUNTER — Ambulatory Visit: Payer: Medicare HMO

## 2018-11-15 ENCOUNTER — Encounter: Payer: Non-veteran care | Admitting: Occupational Therapy

## 2018-11-21 ENCOUNTER — Ambulatory Visit: Payer: Medicare HMO

## 2018-11-21 ENCOUNTER — Other Ambulatory Visit: Payer: Self-pay

## 2018-11-21 ENCOUNTER — Ambulatory Visit: Payer: Medicare HMO | Admitting: Occupational Therapy

## 2018-11-21 DIAGNOSIS — M25532 Pain in left wrist: Secondary | ICD-10-CM

## 2018-11-21 DIAGNOSIS — M25632 Stiffness of left wrist, not elsewhere classified: Secondary | ICD-10-CM

## 2018-11-21 DIAGNOSIS — M25642 Stiffness of left hand, not elsewhere classified: Secondary | ICD-10-CM

## 2018-11-21 DIAGNOSIS — M25552 Pain in left hip: Secondary | ICD-10-CM

## 2018-11-21 DIAGNOSIS — M6281 Muscle weakness (generalized): Secondary | ICD-10-CM
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
## 2018-11-21 NOTE — Patient Instructions (Signed)
Pt can cont with cold laser on scar  Hand gripper to try and increase to 15 or 20 lbs in the next 3 wks  Pronation using flex bar for resistance and place and hold RTB for wrist in flexion, ext, RD, UD

## 2018-11-27 ENCOUNTER — Other Ambulatory Visit: Payer: Self-pay

## 2018-11-27 ENCOUNTER — Encounter: Payer: Non-veteran care | Admitting: Occupational Therapy

## 2018-11-27 ENCOUNTER — Ambulatory Visit: Payer: Medicare HMO

## 2018-11-27 DIAGNOSIS — M25552 Pain in left hip: Secondary | ICD-10-CM

## 2018-11-27 DIAGNOSIS — M6281 Muscle weakness (generalized): Secondary | ICD-10-CM | POA: Diagnosis not present
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
## 2018-12-04 ENCOUNTER — Encounter: Payer: Non-veteran care | Admitting: Occupational Therapy

## 2018-12-04 ENCOUNTER — Ambulatory Visit: Payer: Medicare HMO | Attending: Hand Surgery

## 2018-12-04 ENCOUNTER — Other Ambulatory Visit: Payer: Self-pay

## 2018-12-04 DIAGNOSIS — M25632 Stiffness of left wrist, not elsewhere classified: Secondary | ICD-10-CM | POA: Insufficient documentation

## 2018-12-04 DIAGNOSIS — M25532 Pain in left wrist: Secondary | ICD-10-CM | POA: Diagnosis present

## 2018-12-04 DIAGNOSIS — M79642 Pain in left hand: Secondary | ICD-10-CM | POA: Diagnosis present

## 2018-12-04 DIAGNOSIS — L905 Scar conditions and fibrosis of skin: Secondary | ICD-10-CM | POA: Insufficient documentation

## 2018-12-04 DIAGNOSIS — M25552 Pain in left hip: Secondary | ICD-10-CM | POA: Insufficient documentation

## 2018-12-04 DIAGNOSIS — R600 Localized edema: Secondary | ICD-10-CM | POA: Insufficient documentation

## 2018-12-04 DIAGNOSIS — M25642 Stiffness of left hand, not elsewhere classified: Secondary | ICD-10-CM | POA: Diagnosis present

## 2018-12-04 DIAGNOSIS — M6281 Muscle weakness (generalized): Secondary | ICD-10-CM | POA: Insufficient documentation
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
## 2018-12-06 ENCOUNTER — Ambulatory Visit: Payer: Medicare HMO

## 2018-12-06 ENCOUNTER — Other Ambulatory Visit: Payer: Self-pay

## 2018-12-06 ENCOUNTER — Encounter: Payer: Non-veteran care | Admitting: Occupational Therapy

## 2018-12-06 DIAGNOSIS — M6281 Muscle weakness (generalized): Secondary | ICD-10-CM

## 2018-12-06 DIAGNOSIS — M25552 Pain in left hip: Secondary | ICD-10-CM
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
## 2018-12-11 ENCOUNTER — Ambulatory Visit: Payer: Medicare HMO | Admitting: Occupational Therapy

## 2018-12-11 ENCOUNTER — Other Ambulatory Visit: Payer: Self-pay

## 2018-12-11 ENCOUNTER — Ambulatory Visit: Payer: Medicare HMO

## 2018-12-11 DIAGNOSIS — M25552 Pain in left hip: Secondary | ICD-10-CM

## 2018-12-11 DIAGNOSIS — M25632 Stiffness of left wrist, not elsewhere classified: Secondary | ICD-10-CM

## 2018-12-11 DIAGNOSIS — M6281 Muscle weakness (generalized): Secondary | ICD-10-CM

## 2018-12-11 DIAGNOSIS — R600 Localized edema: Secondary | ICD-10-CM

## 2018-12-11 DIAGNOSIS — M25532 Pain in left wrist: Secondary | ICD-10-CM

## 2018-12-11 DIAGNOSIS — L905 Scar conditions and fibrosis of skin: Secondary | ICD-10-CM

## 2018-12-11 DIAGNOSIS — M25642 Stiffness of left hand, not elsewhere classified: Secondary | ICD-10-CM

## 2018-12-11 DIAGNOSIS — M79642 Pain in left hand: Secondary | ICD-10-CM

## 2018-12-11 NOTE — Patient Instructions (Signed)
Pt cont to stretches for wrist flexion , ext  And cold laser  Scar mobs  Pronation - strengthening - flex bar Gripper increase as tolerated - 20 lbs at this time  Add BTB for scapula retraction , shoulder extention, shoulder ABD, elbow flexion and extention  12 -reps and increase gradually to 2 -3 sets the next couple wks
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
## 2018-12-13 ENCOUNTER — Encounter: Payer: Non-veteran care | Admitting: Occupational Therapy

## 2018-12-17 ENCOUNTER — Other Ambulatory Visit: Payer: Self-pay

## 2018-12-17 DIAGNOSIS — Z20822 Contact with and (suspected) exposure to covid-19: Secondary | ICD-10-CM

## 2018-12-18 ENCOUNTER — Ambulatory Visit: Payer: Medicare HMO

## 2018-12-18 ENCOUNTER — Other Ambulatory Visit: Payer: Self-pay

## 2018-12-18 ENCOUNTER — Encounter: Payer: Non-veteran care | Admitting: Occupational Therapy

## 2018-12-18 DIAGNOSIS — M25552 Pain in left hip: Secondary | ICD-10-CM

## 2018-12-18 DIAGNOSIS — M6281 Muscle weakness (generalized): Secondary | ICD-10-CM | POA: Diagnosis not present

## 2018-12-18 LAB — NOVEL CORONAVIRUS, NAA: SARS-CoV-2, NAA: NOT DETECTED
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
## 2018-12-20 ENCOUNTER — Ambulatory Visit: Payer: Medicare HMO

## 2018-12-20 ENCOUNTER — Encounter: Payer: Non-veteran care | Admitting: Occupational Therapy

## 2018-12-20 ENCOUNTER — Other Ambulatory Visit: Payer: Self-pay

## 2018-12-20 DIAGNOSIS — M25552 Pain in left hip: Secondary | ICD-10-CM

## 2018-12-20 DIAGNOSIS — M6281 Muscle weakness (generalized): Secondary | ICD-10-CM
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
## 2018-12-25 ENCOUNTER — Other Ambulatory Visit: Payer: Self-pay

## 2018-12-25 ENCOUNTER — Encounter: Payer: Non-veteran care | Admitting: Occupational Therapy

## 2018-12-25 ENCOUNTER — Ambulatory Visit: Payer: Medicare HMO

## 2018-12-25 DIAGNOSIS — M25552 Pain in left hip: Secondary | ICD-10-CM

## 2018-12-25 DIAGNOSIS — M6281 Muscle weakness (generalized): Secondary | ICD-10-CM
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
## 2018-12-27 ENCOUNTER — Other Ambulatory Visit: Payer: Self-pay

## 2018-12-27 ENCOUNTER — Encounter: Payer: Non-veteran care | Admitting: Occupational Therapy

## 2018-12-27 ENCOUNTER — Ambulatory Visit: Payer: Medicare HMO

## 2018-12-27 DIAGNOSIS — M25552 Pain in left hip: Secondary | ICD-10-CM

## 2018-12-27 DIAGNOSIS — M6281 Muscle weakness (generalized): Secondary | ICD-10-CM
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
## 2019-01-01 ENCOUNTER — Ambulatory Visit: Payer: Medicare HMO

## 2019-01-01 ENCOUNTER — Other Ambulatory Visit: Payer: Self-pay

## 2019-01-01 ENCOUNTER — Encounter: Payer: Non-veteran care | Admitting: Occupational Therapy

## 2019-01-01 DIAGNOSIS — M25552 Pain in left hip: Secondary | ICD-10-CM

## 2019-01-01 DIAGNOSIS — M6281 Muscle weakness (generalized): Secondary | ICD-10-CM | POA: Diagnosis not present
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
## 2019-01-08 ENCOUNTER — Encounter: Payer: Non-veteran care | Admitting: Occupational Therapy

## 2019-01-15 ENCOUNTER — Ambulatory Visit: Payer: Medicare HMO

## 2019-01-15 ENCOUNTER — Ambulatory Visit: Payer: Medicare HMO | Attending: Hand Surgery | Admitting: Occupational Therapy

## 2019-01-15 ENCOUNTER — Other Ambulatory Visit: Payer: Self-pay

## 2019-01-15 DIAGNOSIS — M79642 Pain in left hand: Secondary | ICD-10-CM | POA: Diagnosis present

## 2019-01-15 DIAGNOSIS — M25532 Pain in left wrist: Secondary | ICD-10-CM | POA: Insufficient documentation

## 2019-01-15 DIAGNOSIS — M25552 Pain in left hip: Secondary | ICD-10-CM | POA: Diagnosis present

## 2019-01-15 DIAGNOSIS — M6281 Muscle weakness (generalized): Secondary | ICD-10-CM | POA: Diagnosis present

## 2019-01-15 DIAGNOSIS — M25642 Stiffness of left hand, not elsewhere classified: Secondary | ICD-10-CM | POA: Diagnosis present

## 2019-01-15 DIAGNOSIS — L905 Scar conditions and fibrosis of skin: Secondary | ICD-10-CM | POA: Diagnosis not present

## 2019-01-15 DIAGNOSIS — M25632 Stiffness of left wrist, not elsewhere classified: Secondary | ICD-10-CM
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
## 2019-01-15 NOTE — Patient Instructions (Signed)
Cont with some composite flexion and extention stretch for wrist  And pronation strengthening
